# Patient Record
Sex: Male | Born: 1980 | Race: White | Hispanic: No | Marital: Married | Smoking: Current every day smoker
Health system: Southern US, Community
[De-identification: ages and names within clinical notes are randomized; demographics above are authoritative.]

---

## 2013-06-19 DIAGNOSIS — F17200 Nicotine dependence, unspecified, uncomplicated: Secondary | ICD-10-CM | POA: Insufficient documentation

## 2014-06-10 ENCOUNTER — Emergency Department: Payer: Self-pay | Admitting: Emergency Medicine

## 2014-06-10 LAB — COMPREHENSIVE METABOLIC PANEL
ALK PHOS: 78 U/L
ALT: 90 U/L — AB (ref 12–78)
Albumin: 3.8 g/dL (ref 3.4–5.0)
Anion Gap: 8 (ref 7–16)
BILIRUBIN TOTAL: 0.5 mg/dL (ref 0.2–1.0)
BUN: 8 mg/dL (ref 7–18)
CO2: 24 mmol/L (ref 21–32)
CREATININE: 1.15 mg/dL (ref 0.60–1.30)
Calcium, Total: 9 mg/dL (ref 8.5–10.1)
Chloride: 106 mmol/L (ref 98–107)
EGFR (African American): >60
EGFR (Non-African Amer.): >60
Glucose: 127 mg/dL — ABNORMAL HIGH (ref 65–99)
Osmolality: 276 (ref 275–301)
Potassium: 3.3 mmol/L — ABNORMAL LOW (ref 3.5–5.1)
SGOT(AST): 64 U/L — ABNORMAL HIGH (ref 15–37)
Sodium: 138 mmol/L (ref 136–145)
Total Protein: 8 g/dL (ref 6.4–8.2)

## 2014-06-10 LAB — URINALYSIS, COMPLETE
BILIRUBIN, UR: NEGATIVE
GLUCOSE, UR: NEGATIVE mg/dL (ref 0–75)
Leukocyte Esterase: NEGATIVE
NITRITE: NEGATIVE
PH: 5 (ref 4.5–8.0)
RBC,UR: 142 /HPF (ref 0–5)
Specific Gravity: 1.015 (ref 1.003–1.030)
Squamous Epithelial: 1
WBC UR: 1 /HPF (ref 0–5)

## 2014-06-10 LAB — CBC
HCT: 46.3 % (ref 40.0–52.0)
HGB: 15.2 g/dL (ref 13.0–18.0)
MCH: 31.6 pg (ref 26.0–34.0)
MCHC: 32.9 g/dL (ref 32.0–36.0)
MCV: 96 fL (ref 80–100)
Platelet: 258 10*3/uL (ref 150–440)
RBC: 4.82 10*6/uL (ref 4.40–5.90)
RDW: 14.3 % (ref 11.5–14.5)
WBC: 12.3 10*3/uL — AB (ref 3.8–10.6)

## 2014-06-10 LAB — LIPASE, BLOOD: Lipase: 122 U/L (ref 73–393)

## 2017-11-24 ENCOUNTER — Ambulatory Visit
Admission: RE | Admit: 2017-11-24 | Discharge: 2017-11-24 | Disposition: A | Discharge status: Home / Self Care | Payer: Managed Care, Other (non HMO) | Source: Ambulatory Visit | Attending: Counselor | Admitting: Counselor

## 2017-11-24 ENCOUNTER — Other Ambulatory Visit: Payer: Self-pay | Admitting: Counselor

## 2017-11-24 DIAGNOSIS — N200 Calculus of kidney: Secondary | ICD-10-CM

## 2017-11-24 DIAGNOSIS — K760 Fatty (change of) liver, not elsewhere classified: Secondary | ICD-10-CM | POA: Diagnosis not present

## 2017-11-24 DIAGNOSIS — R160 Hepatomegaly, not elsewhere classified: Secondary | ICD-10-CM | POA: Insufficient documentation

## 2017-11-24 DIAGNOSIS — I700 Atherosclerosis of aorta: Secondary | ICD-10-CM | POA: Diagnosis not present

## 2018-10-08 ENCOUNTER — Other Ambulatory Visit: Payer: Self-pay | Admitting: Family

## 2018-10-09 ENCOUNTER — Other Ambulatory Visit: Payer: Self-pay | Admitting: Family

## 2018-10-09 DIAGNOSIS — I4510 Unspecified right bundle-branch block: Secondary | ICD-10-CM

## 2018-11-27 ENCOUNTER — Other Ambulatory Visit: Payer: Self-pay | Admitting: Family

## 2018-11-27 DIAGNOSIS — I4510 Unspecified right bundle-branch block: Secondary | ICD-10-CM

## 2018-12-17 ENCOUNTER — Ambulatory Visit
Admission: RE | Admit: 2018-12-17 | Discharge: 2018-12-17 | Disposition: A | Discharge status: Home / Self Care | Payer: Federal, State, Local not specified - PPO | Source: Ambulatory Visit | Attending: Family | Admitting: Family

## 2018-12-17 DIAGNOSIS — I4510 Unspecified right bundle-branch block: Secondary | ICD-10-CM

## 2018-12-17 LAB — EXERCISE TOLERANCE TEST
Estimated workload: 10.1 METS
Exercise duration (min): 9 min
Exercise duration (sec): 0 s
MPHR: 181 {beats}/min
Peak HR: 134 {beats}/min
Percent HR: 74 %
Rest HR: 82 {beats}/min

## 2019-04-08 ENCOUNTER — Ambulatory Visit: Payer: Self-pay

## 2019-04-08 ENCOUNTER — Other Ambulatory Visit: Payer: Federal, State, Local not specified - PPO

## 2019-04-08 DIAGNOSIS — Z20822 Contact with and (suspected) exposure to covid-19: Secondary | ICD-10-CM

## 2019-04-08 NOTE — Telephone Encounter (Signed)
Phone call from [facility] Department received  referral for Covid testing.  Reported Patient  Has Sx of cough, diarrhea.  Hx of COPD, sleep apnea,  Smoking diabetes , severe obesity.   Outgoing call to Patient.  Offered Patient an appointment at 10:45 am  Voiced understanding.

## 2019-04-11 LAB — NOVEL CORONAVIRUS, NAA: SARS-CoV-2, NAA: NOT DETECTED

## 2019-06-19 IMAGING — CT CT ABD-PELV W/O CM
2 of 4 series · 17 of 46 positions shown, 19 images · non-contrast
Comparison: 06/10/2014

CLINICAL DATA: Bilateral flank pain. Nausea. Hematuria. Evaluate
for renal stones.

EXAM:
CT ABDOMEN AND PELVIS WITHOUT CONTRAST
TECHNIQUE: Multidetector CT imaging of the abdomen and pelvis was performed
following the standard protocol without IV contrast.

[Series 2: stone full standard · axial · 0.94mm/px · z∈[-693,-253]mm · 14 of 96 slices shown, 16 images]
[im 4/96  soft-tissue]
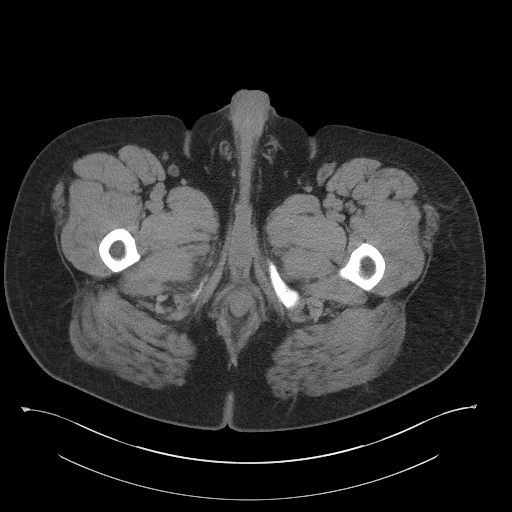
[im 4/96  bone]
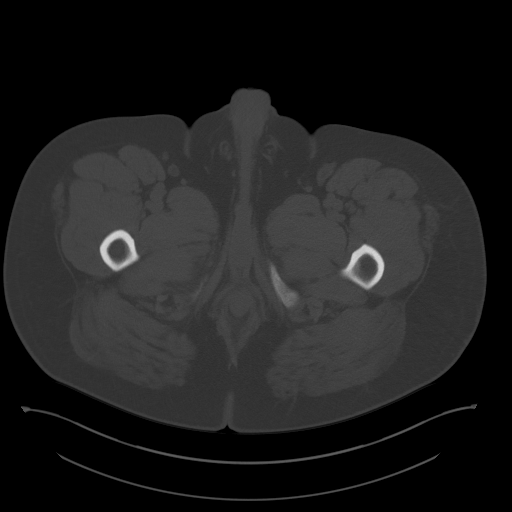
[im 12/96  soft-tissue]
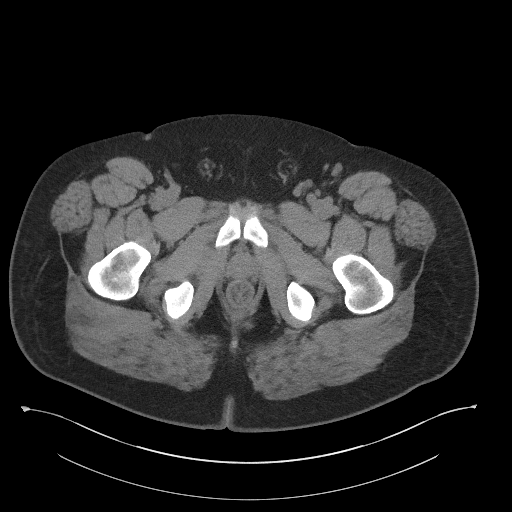
[im 20/96  soft-tissue]
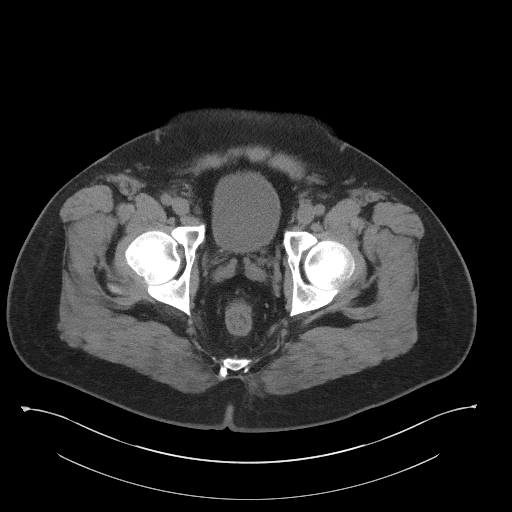
[im 24/96  soft-tissue]
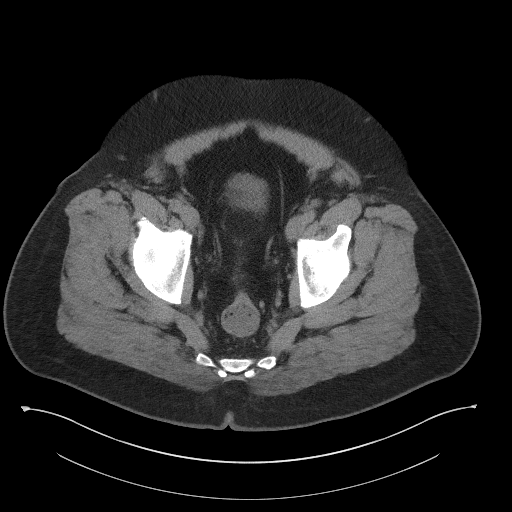
[im 32/96  soft-tissue]
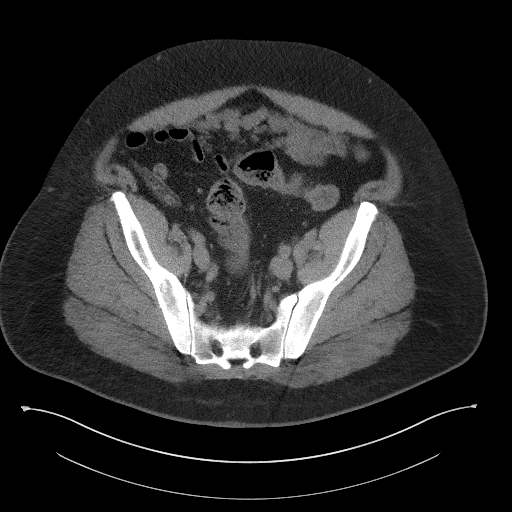
[im 40/96  soft-tissue]
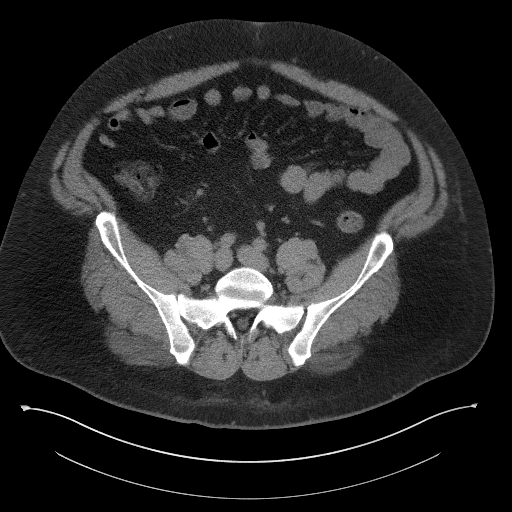
[im 44/96  soft-tissue]
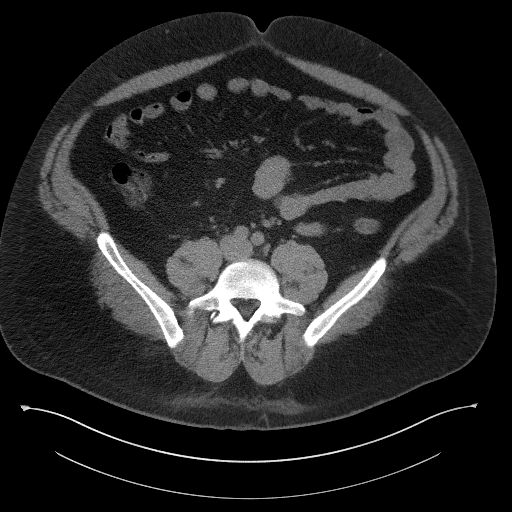
[im 52/96  soft-tissue]
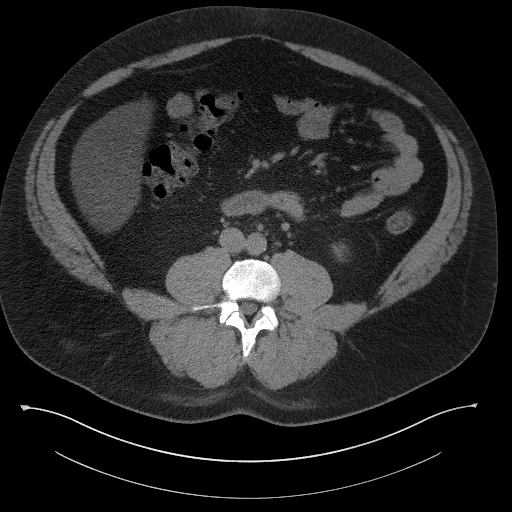
[im 56/96  soft-tissue]
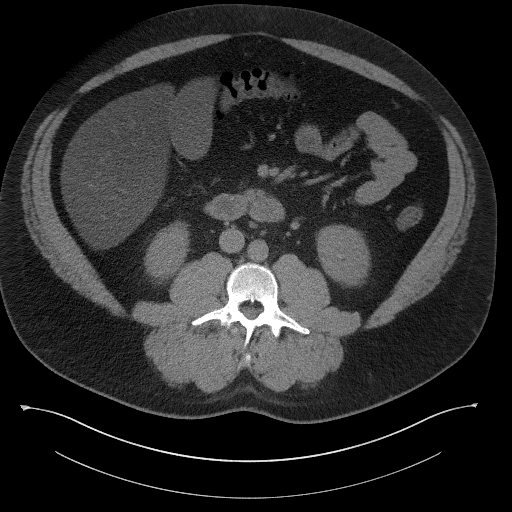
[im 56/96  bone]
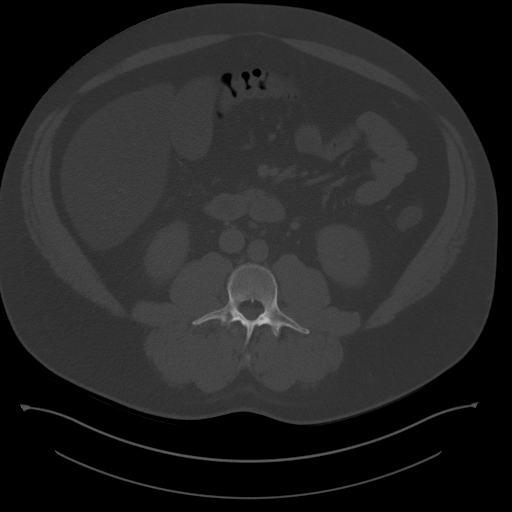
[im 64/96  soft-tissue]
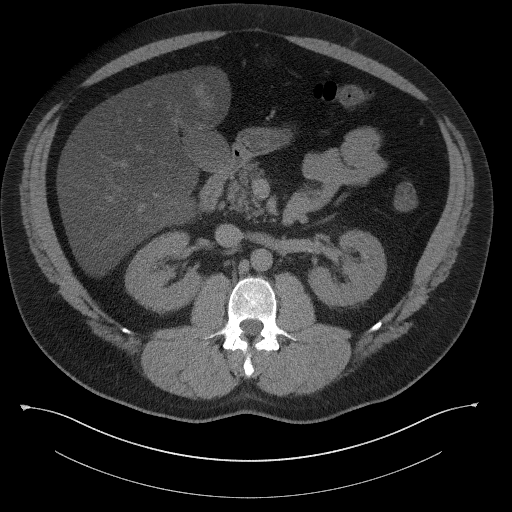
[im 72/96  soft-tissue]
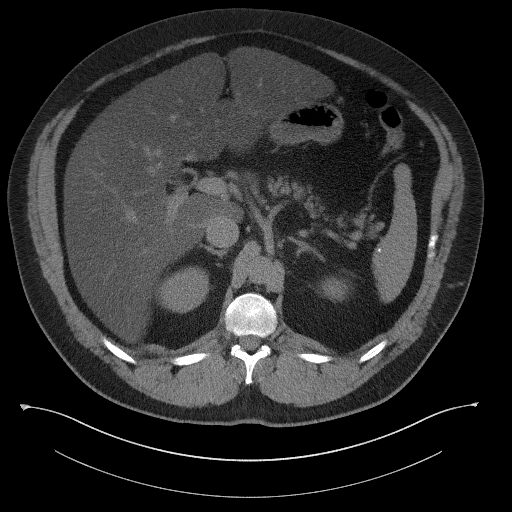
[im 76/96  soft-tissue]
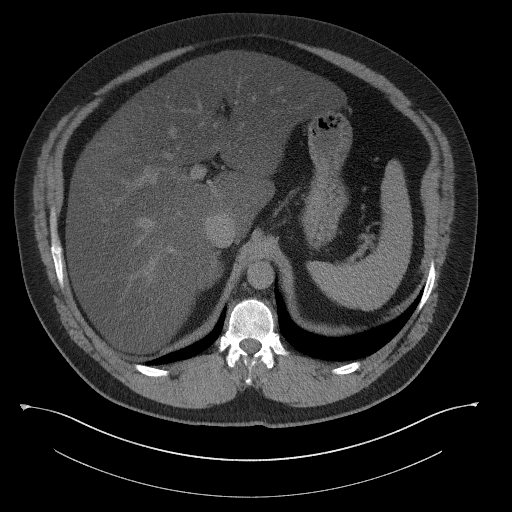
[im 84/96  soft-tissue]
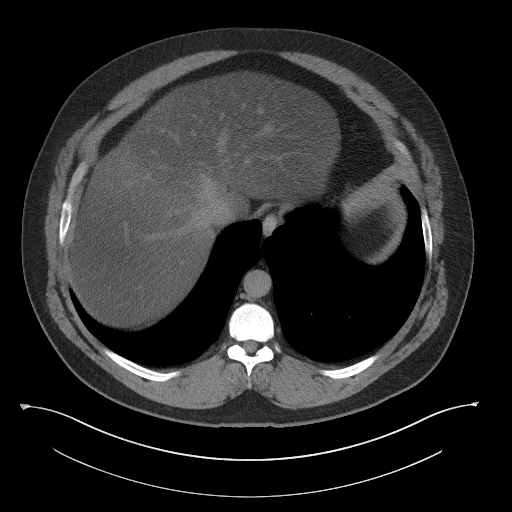
[im 92/96  soft-tissue]
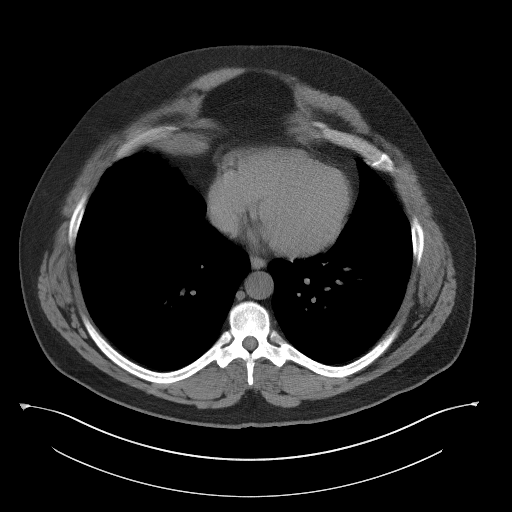

[Series 5: coronal · coronal · 0.92mm/px · 3 of 187 slices shown]
[im 63/187  soft-tissue]
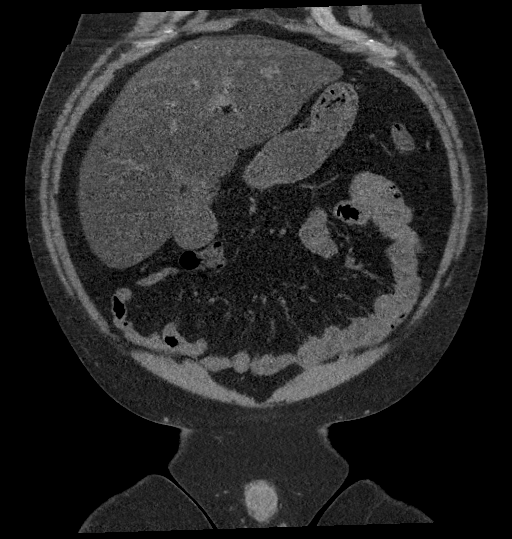
[im 83/187  soft-tissue]
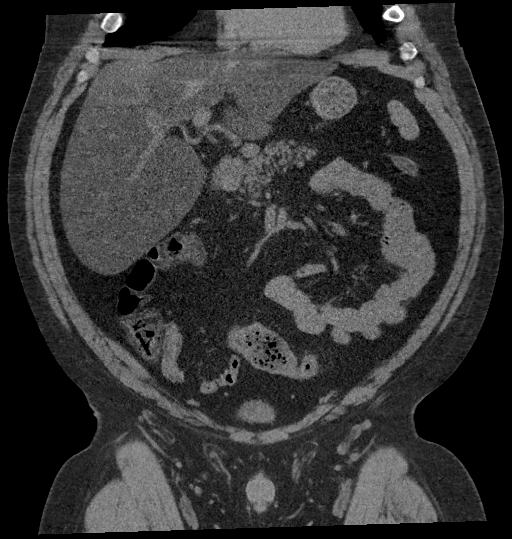
[im 104/187  soft-tissue]
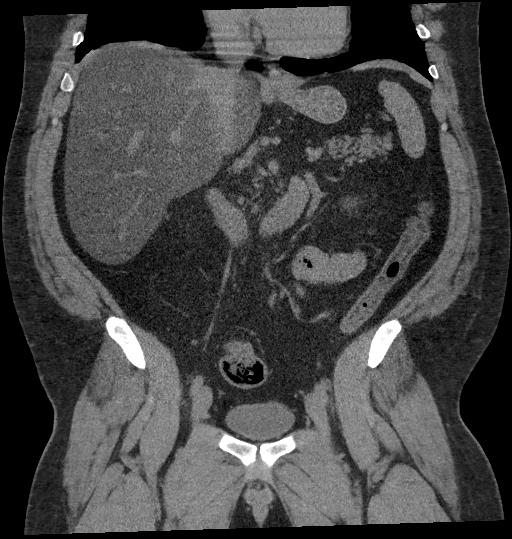

[17 of 46 positions shown; findings below may reference images not displayed]

FINDINGS: Lower chest: Calcified granuloma at the left lung base. Normal heart
size without pericardial or pleural effusion.

Hepatobiliary: Hepatomegaly and marked hepatic steatosis. 21.2 cm
craniocaudal. Sparing of steatosis adjacent the gallbladder. Caudate
and lateral segment left liver lobe prominence. Normal gallbladder,
without biliary ductal dilatation.

Pancreas: Normal, without mass or ductal dilatation.

Spleen: Old granulomatous disease in the spleen.

Adrenals/Urinary Tract: Normal adrenal glands. No renal calculi or
hydronephrosis. No hydroureter or ureteric calculi. No bladder
calculi.

Stomach/Bowel: Normal stomach, without wall thickening. Normal
colon, appendix, and terminal ileum. Normal small bowel.

Vascular/Lymphatic: Aortic atherosclerosis. No abdominopelvic
adenopathy.

Reproductive: Normal prostate.

Other: No significant free fluid.

Musculoskeletal: No acute osseous abnormality.
IMPRESSION: 1.  No urinary tract calculi or hydronephrosis.
2. Marked hepatic steatosis and hepatomegaly. Hepatic morphology for
which early/mild cirrhosis cannot be excluded.
3.  Aortic Atherosclerosis ([id number]).  This is age advanced.

## 2021-04-23 NOTE — Progress Notes (Signed)
[facility] [address] [place], [place] [address]  Pulmonary Sleep Medicine   Office Visit Note  Patient Name: Tyler Ward DOB: Mar 05, 1981 MRN [id number]    Chief Complaint: Obstructive Sleep Apnea visit  Brief History:  Tyler Ward is seen today for initial consult.  Currently on CPAP @ 20cmH2O The patient has a 3 years history of sleep apnea. Patient is using PAP nightly. History of loud habitual snoring, witnessed apneas, daytime sleepiness, restless sleep. The patient feels much better after and reports benefiting from PAP use. Reported sleepiness Epworth Sleepiness Score is 0 out of 24. The patient rarely takes naps. The patient complains of the following of his power cord not staying attached to his unit; he said he has to travels frequently during the week and sometimes setup twice a day.   The download shows  Compliance of 98% with an average use time of 7:48 hours. The AHI is 2.3  The patient does not complain of limb movements disrupting sleep.  ROS  General: (-) fever, (-) chills, (-) night sweat Nose and Sinuses: (-) nasal stuffiness or itchiness, (-) postnasal drip, (-) nosebleeds, (-) sinus trouble. Mouth and Throat: (-) sore throat, (-) hoarseness. Neck: (-) swollen glands, (-) enlarged thyroid, (-) neck pain. Respiratory: - cough, - shortness of breath, - wheezing. Neurologic: - numbness, - tingling. Psychiatric: - anxiety, - depression   Current Medication: No outpatient encounter medications on file as of 04/26/2021.   No facility-administered encounter medications on file as of 04/26/2021.    Surgical History: History reviewed. No pertinent surgical history.  Medical History: History reviewed. No pertinent past medical history.  Family History: Non contributory to the present illness  Social History: Social History   Socioeconomic History  . Marital status: Married    Spouse name: Not on file  . Number of children: Not on file  . Years  of education: Not on file  . Highest education level: Not on file  Occupational History  . Not on file  Tobacco Use  . Smoking status: Current Every Day Smoker    Types: Cigarettes  . Smokeless tobacco: Never Used  Substance and Sexual Activity  . Alcohol use: Not on file  . Drug use: Not on file  . Sexual activity: Not on file  Other Topics Concern  . Not on file  Social History Narrative  . Not on file   Social Determinants of Health   Financial Resource Strain: Not on file  Food Insecurity: Not on file  Transportation Needs: Not on file  Physical Activity: Not on file  Stress: Not on file  Social Connections: Not on file  Intimate Partner Violence: Not on file    Vital Signs: Blood pressure (!) 178/99, pulse 81, resp. rate 20, height 5\' 10"  (1.778 m), weight 228 lb (103.4 kg), SpO2 97 %.  Examination: General Appearance: The patient is well-developed, well-nourished, and in no distress. Neck Circumference: 57 Skin: Gross inspection of skin unremarkable. Head: normocephalic, no gross deformities. Eyes: no gross deformities noted. ENT: ears appear grossly normal Neurologic: Alert and oriented. No involuntary movements.    EPWORTH SLEEPINESS SCALE:  Scale:  (0)= no chance of dozing; (1)= slight chance of dozing; (2)= moderate chance of dozing; (3)= high chance of dozing  Chance  Situtation    Sitting and reading: 0    Watching TV: 0    Sitting Inactive in public: 0    As a passenger in car: 0      Lying  down to rest: 0    Sitting and talking: 0    Sitting quielty after lunch: 0    In a car, stopped in traffic: 0   TOTAL SCORE:   0 out of 24    SLEEP STUDIES:  1. Split 08/21/2018 - AHI 129.4,  Low SpO2  55%,  CPAP @ 20cmH2O and CPAP titration   CPAP COMPLIANCE DATA:  Date Range: 04/23/20 - 04/22/21  Average Daily Use: 7:48 hours  Median Use: 7:36 hours  Compliance for > 4 Hours: 98% days  AHI: 2.3 respiratory events per hour  Days Used:  364/365 days  Mask Leak: 19.7 lpm  95th Percentile Pressure: 20 cmH2O         LABS: No results found for this or any previous visit (from the past 2160 hour(s)).  Radiology: EXERCISE TOLERANCE TEST (ETT)  Result Date: 12/17/2018  Blood pressure demonstrated a hypertensive response to exercise.  No T wave inversion was noted during stress.  There was no ST segment deviation noted during stress.  Normal treadmill stress test with no evidence of ischemia at 75% maximal predicted heart rate..  Suboptimal study due to motion artifact and inability to achieve 85% maximal predicted heart rate. Average exercise capacity with an exercise duration of 9 minutes and hypertensive response to exercise.    No results found.  No results found.    Assessment and Plan: Patient Active Problem List   Diagnosis Date Noted  . OSA on CPAP 04/26/2021  . CPAP use counseling 04/26/2021  . Elevated blood pressure reading without diagnosis of hypertension 04/26/2021  . Obesity (BMI 30-39.9) 04/26/2021  . Smoker 06/19/2013   1. OSA on CPAP The patient does  tolerate PAP and reports definite benefit from PAP use. His machine is not functioning properly due to the cord not staying plugged in. He needs a replacement. The patient has very severe OSA (AHI was 129 on PSG and his lowest oxygen saturation was 55%) The patient was reminded how to clean equipment and advised to replace supplies routinely. . The patient was also counselled on weight loss. The compliance is excellent. . The AHI is 2.3. OSA- continue excellent compliance. Obtain new machine due to malfunctioning of current equipment. F/u 30d after set up.     2. CPAP use counseling CPAP Counseling: had a lengthy discussion with the patient regarding the importance of PAP therapy in management of the sleep apnea. Patient appears to understand the risk factor reduction and also understands the risks associated with untreated sleep apnea. Patient  will try to make a good faith effort to remain compliant with therapy. Also instructed the patient on proper cleaning of the device including the water must be changed daily if possible and use of distilled water is preferred. Patient understands that the machine should be regularly cleaned with appropriate recommended cleaning solutions that do not damage the PAP machine for example given white vinegar and water rinses. Other methods such as ozone treatment may not be as good as these simple methods to achieve cleaning.  3. Elevated blood pressure reading without diagnosis of hypertension He has never been diagnosed with hypertension. He is a smoker. He likes salt in his food. He is obese. He is asymptomatic. He is advised to check bp (wife is a Engineer, civil (consulting) and will check) and f/u with pcp asap.  Hypertension Counseling:   The following hypertensive lifestyle modification were recommended and discussed:  1. Limiting alcohol intake to less than 1 oz/day of ethanol:(24  oz of beer or 8 oz of wine or 2 oz of 100-proof whiskey). 2. Take baby ASA 81 mg daily. 3. Importance of regular aerobic exercise and losing weight. 4. Reduce dietary saturated fat and cholesterol intake for overall cardiovascular health. 5. Maintaining adequate dietary potassium, calcium, and magnesium intake. 6. Regular monitoring of the blood pressure. 7. Reduce sodium intake to less than 100 mmol/day (less than 2.3 gm of sodium or less than 6 gm of sodium choride)   4. Obesity (BMI 30-39.9) Obesity Counseling: Had a lengthy discussion regarding patients BMI and weight issues. Patient was instructed on portion control as well as increased activity. Also discussed caloric restrictions with trying to maintain intake less than 2000 Kcal. Discussions were made in accordance with the 5As of weight management. Simple actions such as not eating late and if able to, taking a walk is suggested. General Counseling: I have discussed the findings of  the evaluation and examination with Gaberial.  I have also discussed any further diagnostic evaluation thatmay be needed or ordered today. Khambrel verbalizes understanding of the findings of todays visit. We also reviewed his medications today and discussed drug interactions and side effects including but not limited excessive drowsiness and altered mental states. We also discussed that there is always a risk not just to him but also people around him. he has been encouraged to call the office with any questions or concerns that should arise related to todays visit.  No orders of the defined types were placed in this encounter.       I have personally obtained a history, examined the patient, evaluated laboratory and imaging results, formulated the assessment and plan and placed orders.   This patient was seen today by Emmaline Kluver, PA-C in collaboration with Dr. Freda Munro.    Yevonne Pax, MD [facility] Diplomate ABMS Pulmonary and Critical Care Medicine Sleep medicine

## 2021-04-26 ENCOUNTER — Ambulatory Visit (INDEPENDENT_AMBULATORY_CARE_PROVIDER_SITE_OTHER): Payer: Federal, State, Local not specified - PPO | Admitting: Internal Medicine

## 2021-04-26 VITALS — BP 178/99 | HR 81 | Resp 20 | Ht 70.0 in | Wt 228.0 lb

## 2021-04-26 DIAGNOSIS — G4733 Obstructive sleep apnea (adult) (pediatric): Secondary | ICD-10-CM | POA: Insufficient documentation

## 2021-04-26 DIAGNOSIS — Z7189 Other specified counseling: Secondary | ICD-10-CM | POA: Diagnosis not present

## 2021-04-26 DIAGNOSIS — R030 Elevated blood-pressure reading, without diagnosis of hypertension: Secondary | ICD-10-CM | POA: Diagnosis not present

## 2021-04-26 DIAGNOSIS — E669 Obesity, unspecified: Secondary | ICD-10-CM

## 2021-04-26 DIAGNOSIS — Z9989 Dependence on other enabling machines and devices: Secondary | ICD-10-CM

## 2021-04-26 NOTE — Patient Instructions (Signed)

## 2022-06-27 ENCOUNTER — Ambulatory Visit (INDEPENDENT_AMBULATORY_CARE_PROVIDER_SITE_OTHER): Payer: Federal, State, Local not specified - PPO | Admitting: Internal Medicine

## 2022-06-27 VITALS — BP 168/95 | HR 84 | Resp 16 | Ht 69.0 in | Wt 325.4 lb

## 2022-06-27 DIAGNOSIS — E669 Obesity, unspecified: Secondary | ICD-10-CM | POA: Diagnosis not present

## 2022-06-27 DIAGNOSIS — G4733 Obstructive sleep apnea (adult) (pediatric): Secondary | ICD-10-CM

## 2022-06-27 DIAGNOSIS — Z7189 Other specified counseling: Secondary | ICD-10-CM

## 2022-06-27 DIAGNOSIS — Z9989 Dependence on other enabling machines and devices: Secondary | ICD-10-CM | POA: Diagnosis not present

## 2022-06-27 NOTE — Patient Instructions (Signed)

## 2022-06-27 NOTE — Progress Notes (Signed)
[facility] [address] [place], [place] [address]  Pulmonary Sleep Medicine   Office Visit Note  Patient Name: Tyler Ward DOB: [birth year] MRN [id number]    Chief Complaint: Obstructive Sleep Apnea visit  Brief History:  Tyler Ward is seen today for an annual follow up with a CPAP at 20 cmh20.  The patient has a 4 year history of sleep apnea. Patient is using PAP nightly.  The patient feels refreshed after sleeping with PAP.  The patient reports benefits from PAP use. Reported sleepiness is  greatly improved and the Epworth Sleepiness Score is 0 out of 24. The patient rarely take naps, maybe once every 2 weeks. The patient complains of the following: Patient reports no complaints with PAP.  The compliance download shows 100% compliance (he uses two machines) with an average use time of 7 hours. The AHI is 2.5.  The patient does not complain of limb movements disrupting sleep.  The patient has 2 machines with 2 separate data downloads  ROS  General: (-) fever, (-) chills, (-) night sweat Nose and Sinuses: (-) nasal stuffiness or itchiness, (-) postnasal drip, (-) nosebleeds, (-) sinus trouble. Mouth and Throat: (-) sore throat, (-) hoarseness. Neck: (-) swollen glands, (-) enlarged thyroid, (-) neck pain. Respiratory: - cough, - shortness of breath, - wheezing. Neurologic: - numbness, - tingling. Psychiatric: - anxiety, - depression   Current Medication: No outpatient encounter medications on file as of 06/27/2022.   No facility-administered encounter medications on file as of 06/27/2022.    Surgical History: History reviewed. No pertinent surgical history.  Medical History: History reviewed. No pertinent past medical history.  Family History: Non contributory to the present illness  Social History: Social History   Socioeconomic History   Marital status: Married    Spouse name: Not on file   Number of children: Not on file   Years of education: Not on  file   Highest education level: Not on file  Occupational History   Not on file  Tobacco Use   Smoking status: Every Day    Types: Cigarettes   Smokeless tobacco: Never  Substance and Sexual Activity   Alcohol use: Not on file   Drug use: Not on file   Sexual activity: Not on file  Other Topics Concern   Not on file  Social History Narrative   Not on file   Social Determinants of Health   Financial Resource Strain: Not on file  Food Insecurity: Not on file  Transportation Needs: Not on file  Physical Activity: Not on file  Stress: Not on file  Social Connections: Not on file  Intimate Partner Violence: Not on file    Vital Signs: Blood pressure (!) 168/95, pulse 84, resp. rate 16, height 5\' 9"  (1.753 m), weight (!) 325 lb 6.4 oz (147.6 kg), SpO2 95 %. Body mass index is 48.05 kg/m.    Examination: General Appearance: The patient is well-developed, well-nourished, and in no distress. Neck Circumference: 54 cm Skin: Gross inspection of skin unremarkable. Head: normocephalic, no gross deformities. Eyes: no gross deformities noted. ENT: ears appear grossly normal Neurologic: Alert and oriented. No involuntary movements.  STOP BANG RISK ASSESSMENT S (snore) Have you been told that you snore?     NO   T (tired) Are you often tired, fatigued, or sleepy during the day?   NO  O (obstruction) Do you stop breathing, choke, or gasp during sleep? NO   P (pressure) Do you have or are you  being treated for high blood pressure? NO   B (BMI) Is your body index greater than 35 kg/m? YES   A (age) Are you 18 years old or older? NO   N (neck) Do you have a neck circumference greater than 16 inches?   YES   G (gender) Are you a male? YES   TOTAL STOP/BANG "YES" ANSWERS 3       A STOP-Bang score of 2 or less is considered low risk, and a score of 5 or more is high risk for having either moderate or severe OSA. For people who score 3 or 4, doctors may need to perform further  assessment to determine how likely they are to have OSA.         EPWORTH SLEEPINESS SCALE:  Scale:  (0)= no chance of dozing; (1)= slight chance of dozing; (2)= moderate chance of dozing; (3)= high chance of dozing  Chance  Situtation    Sitting and reading: 0    Watching TV: 0    Sitting Inactive in public: 0    As a passenger in car: 0      Lying down to rest: 0    Sitting and talking: 0    Sitting quielty after lunch: 0    In a car, stopped in traffic: 0   TOTAL SCORE:   0 out of 24    SLEEP STUDIES:  SPLIT (08/21/18) AHI 129.4, SPO2 55%, CPAP at 20 cm   CPAP COMPLIANCE DATA:  Date Range: 06/28/21 - 06/27/22  Average Daily Use: 7 hours  Median Use: 8  Compliance for > 4 Hours: 359 days  AHI: 2.5 respiratory events per hour  Days Used: 365  Mask Leak: 14.2  95th Percentile Pressure: 20         LABS: No results found for this or any previous visit (from the past 2160 hour(s)).  Radiology: EXERCISE TOLERANCE TEST (ETT)  Result Date: 12/17/2018  Blood pressure demonstrated a hypertensive response to exercise.  No T wave inversion was noted during stress.  There was no ST segment deviation noted during stress.  Normal treadmill stress test with no evidence of ischemia at 75% maximal predicted heart rate..  Suboptimal study due to motion artifact and inability to achieve 85% maximal predicted heart rate. Average exercise capacity with an exercise duration of 9 minutes and hypertensive response to exercise.    No results found.  No results found.    Assessment and Plan: Patient Active Problem List   Diagnosis Date Noted   OSA on CPAP 04/26/2021   CPAP use counseling 04/26/2021   Elevated blood pressure reading without diagnosis of hypertension 04/26/2021   Obesity (BMI 30-39.9) 04/26/2021   Smoker 06/19/2013  1. OSA on CPAP The patient does tolerate PAP and reports  benefit from PAP use. The patient was reminded how to clean equipment  and advised to replace supplies routinely. The patient was also counselled on weight loss. The compliance is excellent. The AHI is 2.9.   OSA on cpap- CPAP continues to be medically necessary to treat this patient's OSA.  Continue with excellent compliance. F/u one year.   2. CPAP use counseling CPAP Counseling: had a lengthy discussion with the patient regarding the importance of PAP therapy in management of the sleep apnea. Patient appears to understand the risk factor reduction and also understands the risks associated with untreated sleep apnea. Patient will try to make a good faith effort to remain compliant with therapy. Also instructed  the patient on proper cleaning of the device including the water must be changed daily if possible and use of distilled water is preferred. Patient understands that the machine should be regularly cleaned with appropriate recommended cleaning solutions that do not damage the PAP machine for example given white vinegar and water rinses. Other methods such as ozone treatment may not be as good as these simple methods to achieve cleaning.   3. Obesity (BMI 30-39.9) Obesity Counseling: Had a lengthy discussion regarding patients BMI and weight issues. Patient was instructed on portion control as well as increased activity. Also discussed caloric restrictions with trying to maintain intake less than 2000 Kcal. Discussions were made in accordance with the 5As of weight management. Simple actions such as not eating late and if able to, taking a walk is suggested.       General Counseling: I have discussed the findings of the evaluation and examination with Andrewjames.  I have also discussed any further diagnostic evaluation thatmay be needed or ordered today. Simmie verbalizes understanding of the findings of todays visit. We also reviewed his medications today and discussed drug interactions and side effects including but not limited excessive drowsiness and altered mental  states. We also discussed that there is always a risk not just to him but also people around him. he has been encouraged to call the office with any questions or concerns that should arise related to todays visit.  No orders of the defined types were placed in this encounter.       I have personally obtained a history, examined the patient, evaluated laboratory and imaging results, formulated the assessment and plan and placed orders. This patient was seen today by Tressie Ellis, PA-C in collaboration with Dr. Devona Konig.   Allyne Gee, MD [facility] Diplomate ABMS Pulmonary Critical Care Medicine and Sleep Medicine

## 2023-06-26 ENCOUNTER — Ambulatory Visit: Payer: Federal, State, Local not specified - PPO

## 2023-06-26 VITALS — BP 143/78 | HR 85 | Resp 14 | Ht 69.0 in | Wt 318.0 lb

## 2023-06-26 DIAGNOSIS — Z7189 Other specified counseling: Secondary | ICD-10-CM

## 2023-06-26 DIAGNOSIS — G4733 Obstructive sleep apnea (adult) (pediatric): Secondary | ICD-10-CM

## 2023-06-26 NOTE — Progress Notes (Signed)
[facility] [address] [place], [place] [address]  Pulmonary Sleep Medicine   Office Visit Note  Patient Name: Tyler Ward DOB: 06-20-1981 MRN [id number]    Chief Complaint: Obstructive Sleep Apnea visit  Brief History:  Brigido presents for an annual follow up on CPAP at 20 cmh20. The patient has a 5 year history of sleep apnea and is currently on a CPAP. Sleep quality is good. This is noted most nights. Prior to using a PAP, the patient's bed partner/ family reported the following symptoms:  loud snoring and witnessed apnea at night. The patient relates the following symptoms currently: No complaints . The patient goes to sleep at 8:30 pm and wakes up at 5 am. The patient denies a history of psychiatric problems. The Epworth Sleepiness Score is 0 out of 24 . The patient's STOP-BANG score is 3. The patient relates  Cardiovascular risk factors include: none. . The patient is currently on a PAP@ 20 cmH2O. The patient reports using her/his CPAP and feels rested after sleeping with PAP.  The patient reports benefiting from PAP use and would like for her/him to continue using PAP. Reported sleepiness is improved. The compliance download shows 73% compliance with an average use time of 6 hours 19 minutes. The AHI is 2.7.  The patient continues to require PAP therapy as a medical necessity in order to eliminate his/her sleep apnea.   ROS  General: (-) fever, (-) chills, (-) night sweat Nose and Sinuses: (-) nasal stuffiness or itchiness, (-) postnasal drip, (-) nosebleeds, (-) sinus trouble. Mouth and Throat: (-) sore throat, (-) hoarseness. Neck: (-) swollen glands, (-) enlarged thyroid, (-) neck pain. Respiratory: - cough, - shortness of breath, - wheezing. Neurologic: - numbness, - tingling. Psychiatric: - anxiety, - depression   Current Medication: No outpatient encounter medications on file as of 06/26/2023.   No facility-administered encounter medications on file as of  06/26/2023.    Surgical History: History reviewed. No pertinent surgical history.  Medical History: History reviewed. No pertinent past medical history.  Family History: Non contributory to the present illness  Social History: Social History   Socioeconomic History   Marital status: Married    Spouse name: Not on file   Number of children: Not on file   Years of education: Not on file   Highest education level: Not on file  Occupational History   Not on file  Tobacco Use   Smoking status: Every Day    Types: Cigarettes   Smokeless tobacco: Never  Substance and Sexual Activity   Alcohol use: Not on file   Drug use: Not on file   Sexual activity: Not on file  Other Topics Concern   Not on file  Social History Narrative   Not on file   Social Determinants of Health   Financial Resource Strain: Not on file  Food Insecurity: Not on file  Transportation Needs: Not on file  Physical Activity: Not on file  Stress: Not on file  Social Connections: Not on file  Intimate Partner Violence: Not on file    Vital Signs: Blood pressure (!) 143/78, pulse 85, resp. rate 14, height 5\' 9"  (1.753 m), weight (!) 318 lb (144.2 kg), SpO2 96%. Body mass index is 46.96 kg/m.    Examination: General Appearance: The patient is well-developed, well-nourished, and in no distress. Neck Circumference: 53 cm Skin: Gross inspection of skin unremarkable. Head: normocephalic, no gross deformities. Eyes: no gross deformities noted. ENT: ears appear grossly normal  Neurologic: Alert and oriented. No involuntary movements.  STOP BANG RISK ASSESSMENT S (snore) Have you been told that you snore?     NO   T (tired) Are you often tired, fatigued, or sleepy during the day?   NO  O (obstruction) Do you stop breathing, choke, or gasp during sleep? NO   P (pressure) Do you have or are you being treated for high blood pressure? NO   B (BMI) Is your body index greater than 35 kg/m? YES   A (age)  Are you 44 years old or older? NO   N (neck) Do you have a neck circumference greater than 16 inches?   YES   G (gender) Are you a male? YES   TOTAL STOP/BANG "YES" ANSWERS 3       A STOP-Bang score of 2 or less is considered low risk, and a score of 5 or more is high risk for having either moderate or severe OSA. For people who score 3 or 4, doctors may need to perform further assessment to determine how likely they are to have OSA.         EPWORTH SLEEPINESS SCALE:  Scale:  (0)= no chance of dozing; (1)= slight chance of dozing; (2)= moderate chance of dozing; (3)= high chance of dozing  Chance  Situtation    Sitting and reading: 0    Watching TV: 0    Sitting Inactive in public: 0    As a passenger in car: 0      Lying down to rest: 0    Sitting and talking: 0    Sitting quielty after lunch: 0    In a car, stopped in traffic: 0   TOTAL SCORE:   0 out of 24    SLEEP STUDIES:  SPLIT (08/21/18) AHI 129.4, min SPO2 55%, recommended CPAP at 20 cmh20   CPAP COMPLIANCE DATA:  Date Range: 05/27/23 - 06/25/23  Average Daily Use: 6:19 hours  Median Use: 7:10 hours  Compliance for > 4 Hours: 22 days  AHI: 2.7 respiratory events per hour  Days Used: 25/30  Mask Leak: 24  95th Percentile Pressure: 20 cmh20         LABS: No results found for this or any previous visit (from the past 2160 hour(s)).  Radiology: EXERCISE TOLERANCE TEST (ETT)  Result Date: 12/17/2018  Blood pressure demonstrated a hypertensive response to exercise.  No T wave inversion was noted during stress.  There was no ST segment deviation noted during stress.  Normal treadmill stress test with no evidence of ischemia at 75% maximal predicted heart rate..  Suboptimal study due to motion artifact and inability to achieve 85% maximal predicted heart rate. Average exercise capacity with an exercise duration of 9 minutes and hypertensive response to exercise.    No results  found.  No results found.    Assessment and Plan: Patient Active Problem List   Diagnosis Date Noted   Morbid obesity (HCC) 06/26/2023   OSA on CPAP 04/26/2021   CPAP use counseling 04/26/2021   Elevated blood pressure reading without diagnosis of hypertension 04/26/2021   Obesity (BMI 30-39.9) 04/26/2021   Smoker 06/19/2013   1. OSA on CPAP The patient does tolerate PAP and reports  benefit from PAP use. His machine is at end of life and must be replaced. The patient was reminded how to clean equipment and advised to replace supplies routinely. The patient was also counselled on weight loss. The compliance is excellent between  his two machines. . The AHI is 2.6.   OSA on cpap- controlled. Continue with excellent compliance Replace machine.  CPAP continues to be medically necessary to treat this patient's OSA. F/u 30d after setup   2. CPAP use counseling CPAP Counseling: had a lengthy discussion with the patient regarding the importance of PAP therapy in management of the sleep apnea. Patient appears to understand the risk factor reduction and also understands the risks associated with untreated sleep apnea. Patient will try to make a good faith effort to remain compliant with therapy. Also instructed the patient on proper cleaning of the device including the water must be changed daily if possible and use of distilled water is preferred. Patient understands that the machine should be regularly cleaned with appropriate recommended cleaning solutions that do not damage the PAP machine for example given white vinegar and water rinses. Other methods such as ozone treatment may not be as good as these simple methods to achieve cleaning.   3. Morbid obesity (HCC) Obesity Counseling: Had a lengthy discussion regarding patients BMI and weight issues. Patient was instructed on portion control as well as increased activity. Also discussed caloric restrictions with trying to maintain intake less  than 2000 Kcal. Discussions were made in accordance with the 5As of weight management. Simple actions such as not eating late and if able to, taking a walk is suggested.      General Counseling: I have discussed the findings of the evaluation and examination with [place].  I have also discussed any further diagnostic evaluation thatmay be needed or ordered today. Yogesh verbalizes understanding of the findings of todays visit. We also reviewed his medications today and discussed drug interactions and side effects including but not limited excessive drowsiness and altered mental states. We also discussed that there is always a risk not just to him but also people around him. he has been encouraged to call the office with any questions or concerns that should arise related to todays visit.  No orders of the defined types were placed in this encounter.       I have personally obtained a history, examined the patient, evaluated laboratory and imaging results, formulated the assessment and plan and placed orders. This patient was seen today by Emmaline Kluver, PA-C in collaboration with Dr. Freda Munro.   Yevonne Pax, MD [facility] Diplomate ABMS Pulmonary Critical Care Medicine and Sleep Medicine

## 2023-06-26 NOTE — Patient Instructions (Signed)

## 2024-06-28 NOTE — Progress Notes (Signed)
 [facility] [address] [place], [place] [address]  Pulmonary Sleep Medicine   Office Visit Note  Patient Name: Tyler Ward DOB: 07/17/1981 MRN [id number]    Chief Complaint: Obstructive Sleep Apnea visit  Brief History:  Tyler Ward is seen today for an annual follow up visit for CPAP@ 20 cmH2O. The patient has a 6 year history of sleep apnea. Patient is using PAP nightly.  The patient feels rested after sleeping with PAP.  The patient reports benefiting from PAP use. Reported sleepiness is  improved and the Epworth Sleepiness Score is 2 out of 24. The patient does not take naps. The patient complains of the following: pt is interested in getting a travel unit. The compliance download shows 76% compliance with an average use time of 7 hours 24 minutes. The AHI is 3.0.  The patient does not complain of limb movements disrupting sleep. The patient continues to require PAP therapy in order to eliminate sleep apnea. The patient has stopped using alcohol since last visit.   ROS  General: (-) fever, (-) chills, (-) night sweat Nose and Sinuses: (-) nasal stuffiness or itchiness, (-) postnasal drip, (-) nosebleeds, (-) sinus trouble. Mouth and Throat: (-) sore throat, (-) hoarseness. Neck: (-) swollen glands, (-) enlarged thyroid, (-) neck pain. Respiratory: - cough, - shortness of breath, - wheezing. Neurologic: - numbness, - tingling. Psychiatric: - anxiety, - depression   Current Medication: No outpatient encounter medications on file as of 07/01/2024.   No facility-administered encounter medications on file as of 07/01/2024.    Surgical History: History reviewed. No pertinent surgical history.  Medical History: History reviewed. No pertinent past medical history.  Family History: Non contributory to the present illness  Social History: Social History   Socioeconomic History   Marital status: Married    Spouse name: Not on file   Number of children: Not on  file   Years of education: Not on file   Highest education level: Not on file  Occupational History   Not on file  Tobacco Use   Smoking status: Every Day    Types: Cigarettes   Smokeless tobacco: Never  Substance and Sexual Activity   Alcohol use: Not on file   Drug use: Not on file   Sexual activity: Not on file  Other Topics Concern   Not on file  Social History Narrative   Not on file   Social Drivers of Health   Financial Resource Strain: Not on file  Food Insecurity: Not on file  Transportation Needs: Not on file  Physical Activity: Not on file  Stress: Not on file  Social Connections: Not on file  Intimate Partner Violence: Not on file    Vital Signs: Blood pressure 134/83, pulse 88, resp. rate 16, height 5' 9 (1.753 m), weight (!) 327 lb (148.3 kg), SpO2 98%. Body mass index is 48.29 kg/m.    Examination: General Appearance: The patient is well-developed, well-nourished, and in no distress. Neck Circumference: 50 cm Skin: Gross inspection of skin unremarkable. Head: normocephalic, no gross deformities. Eyes: no gross deformities noted. ENT: ears appear grossly normal Neurologic: Alert and oriented. No involuntary movements.  STOP BANG RISK ASSESSMENT S (snore) Have you been told that you snore?     NO   T (tired) Are you often tired, fatigued, or sleepy during the day?   NO  O (obstruction) Do you stop breathing, choke, or gasp during sleep? NO   P (pressure) Do you have or are you  being treated for high blood pressure? NO   B (BMI) Is your body index greater than 35 kg/m? YES   A (age) Are you 69 years old or older? NO   N (neck) Do you have a neck circumference greater than 16 inches?   YES   G (gender) Are you a male? YES   TOTAL STOP/BANG "YES" ANSWERS 3       A STOP-Bang score of 2 or less is considered low risk, and a score of 5 or more is high risk for having either moderate or severe OSA. For people who score 3 or 4, doctors may need  to perform further assessment to determine how likely they are to have OSA.         EPWORTH SLEEPINESS SCALE:  Scale:  (0)= no chance of dozing; (1)= slight chance of dozing; (2)= moderate chance of dozing; (3)= high chance of dozing  Chance  Situtation    Sitting and reading: 0    Watching TV: 1    Sitting Inactive in public: 0    As a passenger in car: 0      Lying down to rest: 1    Sitting and talking: 0    Sitting quielty after lunch: 0    In a car, stopped in traffic: 0   TOTAL SCORE:   2 out of 24    SLEEP STUDIES:  SPLIT (08/21/18) AHI 129.4, min SPO2 55%, recommended CPAP at 20 cmh20    CPAP COMPLIANCE DATA:  Date Range: 07/02/2023-06/30/2024  Average Daily Use: 7 hours 24 minutes  Median Use: 7 hours 19 minutes  Compliance for > 4 Hours: 76%  AHI: 3.0 respiratory events per hour  Days Used: 285/365 days  Mask Leak: 21.4  95th Percentile Pressure: 20         LABS: No results found for this or any previous visit (from the past 2160 hours).  Radiology: EXERCISE TOLERANCE TEST (ETT) Result Date: 12/17/2018  Blood pressure demonstrated a hypertensive response to exercise.  No T wave inversion was noted during stress.  There was no ST segment deviation noted during stress.  Normal treadmill stress test with no evidence of ischemia at 75% maximal predicted heart rate..  Suboptimal study due to motion artifact and inability to achieve 85% maximal predicted heart rate. Average exercise capacity with an exercise duration of 9 minutes and hypertensive response to exercise.    No results found.  No results found.    Assessment and Plan: Patient Active Problem List   Diagnosis Date Noted   Morbid obesity (HCC) 06/26/2023   OSA on CPAP 04/26/2021   CPAP use counseling 04/26/2021   Elevated blood pressure reading without diagnosis of hypertension 04/26/2021   Obesity (BMI 30-39.9) 04/26/2021   Smoker 06/19/2013    1. OSA on CPAP  (Primary) The patient does tolerate PAP and reports  benefit from PAP use. The patient was reminded how to clean equipment and advised to replace supplies routinely. The patient was also counselled on weight loss. The compliance is very good. The AHI is 3.0.   OSA on cpap- controlled. Continue with excellent compliance with pap. CPAP continues to be medically necessary to treat this patient's OSA. F/u one year.     2. CPAP use counseling CPAP Counseling: had a lengthy discussion with the patient regarding the importance of PAP therapy in management of the sleep apnea. Patient appears to understand the risk factor reduction and also understands the risks associated with  untreated sleep apnea. Patient will try to make a good faith effort to remain compliant with therapy. Also instructed the patient on proper cleaning of the device including the water must be changed daily if possible and use of distilled water is preferred. Patient understands that the machine should be regularly cleaned with appropriate recommended cleaning solutions that do not damage the PAP machine for example given white vinegar and water rinses. Other methods such as ozone treatment may not be as good as these simple methods to achieve cleaning.   3. Morbid obesity (HCC) Obesity Counseling: Had a lengthy discussion regarding patients BMI and weight issues. Patient was instructed on portion control as well as increased activity. Also discussed caloric restrictions with trying to maintain intake less than 2000 Kcal. Discussions were made in accordance with the 5As of weight management. Simple actions such as not eating late and if able to, taking a walk is suggested.     General Counseling: I have discussed the findings of the evaluation and examination with Tyler Ward.  I have also discussed any further diagnostic evaluation thatmay be needed or ordered today. Tyler Ward verbalizes understanding of the findings of todays visit. We also  reviewed his medications today and discussed drug interactions and side effects including but not limited excessive drowsiness and altered mental states. We also discussed that there is always a risk not just to him but also people around him. he has been encouraged to call the office with any questions or concerns that should arise related to todays visit.  No orders of the defined types were placed in this encounter.       I have personally obtained a history, examined the patient, evaluated laboratory and imaging results, formulated the assessment and plan and placed orders. This patient was seen today by Lauraine Lay, PA-C in collaboration with Dr. Elfreda Bathe.   Elfreda DELENA Bathe, MD [facility] Diplomate ABMS Pulmonary Critical Care Medicine and Sleep Medicine

## 2024-07-01 ENCOUNTER — Ambulatory Visit (INDEPENDENT_AMBULATORY_CARE_PROVIDER_SITE_OTHER): Admitting: Internal Medicine

## 2024-07-01 VITALS — BP 134/83 | HR 88 | Resp 16 | Ht 69.0 in | Wt 327.0 lb

## 2024-07-01 DIAGNOSIS — Z7189 Other specified counseling: Secondary | ICD-10-CM | POA: Diagnosis not present

## 2024-07-01 DIAGNOSIS — G4733 Obstructive sleep apnea (adult) (pediatric): Secondary | ICD-10-CM | POA: Diagnosis not present

## 2024-07-01 NOTE — Patient Instructions (Signed)
